# Patient Record
Sex: Male | Born: 1960 | Race: White | Hispanic: No | Marital: Married | Smoking: Never smoker
Health system: Southern US, Community
[De-identification: ages and names within clinical notes are randomized; demographics above are authoritative.]

## PROBLEM LIST (undated history)

## (undated) DIAGNOSIS — I429 Cardiomyopathy, unspecified: Secondary | ICD-10-CM

## (undated) DIAGNOSIS — E119 Type 2 diabetes mellitus without complications: Secondary | ICD-10-CM

## (undated) DIAGNOSIS — I10 Essential (primary) hypertension: Secondary | ICD-10-CM

## (undated) DIAGNOSIS — C801 Malignant (primary) neoplasm, unspecified: Secondary | ICD-10-CM

## (undated) HISTORY — PX: TUMOR EXCISION: SHX421

## (undated) HISTORY — PX: NEPHRECTOMY: SHX65

## (undated) HISTORY — PX: KNEE ARTHROSCOPY: SHX127

---

## 2018-09-16 ENCOUNTER — Inpatient Hospital Stay (HOSPITAL_COMMUNITY): Payer: Medicare HMO

## 2018-09-16 ENCOUNTER — Encounter (HOSPITAL_COMMUNITY): Admission: EM | Disposition: A | Discharge status: Home / Self Care | Payer: Self-pay | Source: Home / Self Care

## 2018-09-16 ENCOUNTER — Other Ambulatory Visit: Payer: Self-pay

## 2018-09-16 ENCOUNTER — Inpatient Hospital Stay (HOSPITAL_COMMUNITY)
Admission: EM | Admit: 2018-09-16 | Discharge: 2018-09-26 | DRG: 958 | Disposition: A | Discharge status: Home / Self Care | Payer: Medicare HMO | Attending: General Surgery | Admitting: General Surgery

## 2018-09-16 ENCOUNTER — Emergency Department (HOSPITAL_COMMUNITY): Payer: Medicare HMO

## 2018-09-16 ENCOUNTER — Emergency Department (HOSPITAL_COMMUNITY): Payer: Medicare HMO | Admitting: Anesthesiology

## 2018-09-16 ENCOUNTER — Encounter (HOSPITAL_COMMUNITY): Payer: Self-pay | Admitting: Emergency Medicine

## 2018-09-16 DIAGNOSIS — D496 Neoplasm of unspecified behavior of brain: Secondary | ICD-10-CM | POA: Diagnosis present

## 2018-09-16 DIAGNOSIS — I429 Cardiomyopathy, unspecified: Secondary | ICD-10-CM | POA: Diagnosis present

## 2018-09-16 DIAGNOSIS — R402253 Coma scale, best verbal response, oriented, at hospital admission: Secondary | ICD-10-CM | POA: Diagnosis present

## 2018-09-16 DIAGNOSIS — S82401A Unspecified fracture of shaft of right fibula, initial encounter for closed fracture: Secondary | ICD-10-CM | POA: Diagnosis present

## 2018-09-16 DIAGNOSIS — S065X0A Traumatic subdural hemorrhage without loss of consciousness, initial encounter: Secondary | ICD-10-CM | POA: Diagnosis present

## 2018-09-16 DIAGNOSIS — S066X0A Traumatic subarachnoid hemorrhage without loss of consciousness, initial encounter: Secondary | ICD-10-CM | POA: Diagnosis present

## 2018-09-16 DIAGNOSIS — S27329A Contusion of lung, unspecified, initial encounter: Secondary | ICD-10-CM | POA: Diagnosis present

## 2018-09-16 DIAGNOSIS — N182 Chronic kidney disease, stage 2 (mild): Secondary | ICD-10-CM | POA: Diagnosis present

## 2018-09-16 DIAGNOSIS — R443 Hallucinations, unspecified: Secondary | ICD-10-CM | POA: Diagnosis present

## 2018-09-16 DIAGNOSIS — Z85528 Personal history of other malignant neoplasm of kidney: Secondary | ICD-10-CM

## 2018-09-16 DIAGNOSIS — R339 Retention of urine, unspecified: Secondary | ICD-10-CM | POA: Diagnosis present

## 2018-09-16 DIAGNOSIS — S82202A Unspecified fracture of shaft of left tibia, initial encounter for closed fracture: Secondary | ICD-10-CM | POA: Diagnosis present

## 2018-09-16 DIAGNOSIS — Z20828 Contact with and (suspected) exposure to other viral communicable diseases: Secondary | ICD-10-CM | POA: Diagnosis present

## 2018-09-16 DIAGNOSIS — S7290XA Unspecified fracture of unspecified femur, initial encounter for closed fracture: Secondary | ICD-10-CM | POA: Diagnosis present

## 2018-09-16 DIAGNOSIS — Z23 Encounter for immunization: Secondary | ICD-10-CM | POA: Diagnosis present

## 2018-09-16 DIAGNOSIS — Z419 Encounter for procedure for purposes other than remedying health state, unspecified: Secondary | ICD-10-CM

## 2018-09-16 DIAGNOSIS — I129 Hypertensive chronic kidney disease with stage 1 through stage 4 chronic kidney disease, or unspecified chronic kidney disease: Secondary | ICD-10-CM | POA: Diagnosis present

## 2018-09-16 DIAGNOSIS — S2242XA Multiple fractures of ribs, left side, initial encounter for closed fracture: Secondary | ICD-10-CM | POA: Diagnosis present

## 2018-09-16 DIAGNOSIS — S82402A Unspecified fracture of shaft of left fibula, initial encounter for closed fracture: Secondary | ICD-10-CM | POA: Diagnosis present

## 2018-09-16 DIAGNOSIS — S2232XA Fracture of one rib, left side, initial encounter for closed fracture: Secondary | ICD-10-CM

## 2018-09-16 DIAGNOSIS — R402363 Coma scale, best motor response, obeys commands, at hospital admission: Secondary | ICD-10-CM | POA: Diagnosis present

## 2018-09-16 DIAGNOSIS — S82201A Unspecified fracture of shaft of right tibia, initial encounter for closed fracture: Secondary | ICD-10-CM | POA: Diagnosis present

## 2018-09-16 DIAGNOSIS — S2249XA Multiple fractures of ribs, unspecified side, initial encounter for closed fracture: Secondary | ICD-10-CM | POA: Diagnosis present

## 2018-09-16 DIAGNOSIS — D649 Anemia, unspecified: Secondary | ICD-10-CM | POA: Diagnosis present

## 2018-09-16 DIAGNOSIS — R402143 Coma scale, eyes open, spontaneous, at hospital admission: Secondary | ICD-10-CM | POA: Diagnosis present

## 2018-09-16 DIAGNOSIS — T07XXXA Unspecified multiple injuries, initial encounter: Secondary | ICD-10-CM

## 2018-09-16 DIAGNOSIS — E1122 Type 2 diabetes mellitus with diabetic chronic kidney disease: Secondary | ICD-10-CM | POA: Diagnosis present

## 2018-09-16 DIAGNOSIS — S2239XA Fracture of one rib, unspecified side, initial encounter for closed fracture: Secondary | ICD-10-CM | POA: Diagnosis present

## 2018-09-16 HISTORY — DX: Type 2 diabetes mellitus without complications: E11.9

## 2018-09-16 HISTORY — DX: Malignant (primary) neoplasm, unspecified: C80.1

## 2018-09-16 HISTORY — DX: Cardiomyopathy, unspecified: I42.9

## 2018-09-16 HISTORY — PX: TIBIA IM NAIL INSERTION: SHX2516

## 2018-09-16 HISTORY — DX: Essential (primary) hypertension: I10

## 2018-09-16 LAB — SARS CORONAVIRUS 2 BY RT PCR (HOSPITAL ORDER, PERFORMED IN ~~LOC~~ HOSPITAL LAB): SARS Coronavirus 2: NEGATIVE

## 2018-09-16 LAB — COMPREHENSIVE METABOLIC PANEL
ALT: 31 U/L (ref 0–44)
AST: 33 U/L (ref 15–41)
Albumin: 4.5 g/dL (ref 3.5–5.0)
Alkaline Phosphatase: 80 U/L (ref 38–126)
Anion gap: 12 (ref 5–15)
BUN: 18 mg/dL (ref 6–20)
CO2: 20 mmol/L — ABNORMAL LOW (ref 22–32)
Calcium: 9.8 mg/dL (ref 8.9–10.3)
Chloride: 111 mmol/L (ref 98–111)
Creatinine, Ser: 1.59 mg/dL — ABNORMAL HIGH (ref 0.61–1.24)
GFR calc Af Amer: 54 mL/min — ABNORMAL LOW (ref >60)
GFR calc non Af Amer: 47 mL/min — ABNORMAL LOW (ref >60)
Glucose, Bld: 221 mg/dL — ABNORMAL HIGH (ref 70–99)
Potassium: 4.6 mmol/L (ref 3.5–5.1)
Sodium: 143 mmol/L (ref 135–145)
Total Bilirubin: 1.1 mg/dL (ref 0.3–1.2)
Total Protein: 7.6 g/dL (ref 6.5–8.1)

## 2018-09-16 LAB — SAMPLE TO BLOOD BANK

## 2018-09-16 LAB — CBC
HCT: 46 % (ref 39.0–52.0)
Hemoglobin: 16.1 g/dL (ref 13.0–17.0)
MCH: 30.1 pg (ref 26.0–34.0)
MCHC: 35 g/dL (ref 30.0–36.0)
MCV: 86 fL (ref 80.0–100.0)
Platelets: 250 10*3/uL (ref 150–400)
RBC: 5.35 MIL/uL (ref 4.22–5.81)
RDW: 12 % (ref 11.5–15.5)
WBC: 13.3 10*3/uL — ABNORMAL HIGH (ref 4.0–10.5)
nRBC: 0 % (ref 0.0–0.2)

## 2018-09-16 LAB — I-STAT CHEM 8, ED
BUN: 20 mg/dL (ref 6–20)
Calcium, Ion: 1.15 mmol/L (ref 1.15–1.40)
Chloride: 112 mmol/L — ABNORMAL HIGH (ref 98–111)
Creatinine, Ser: 1.5 mg/dL — ABNORMAL HIGH (ref 0.61–1.24)
Glucose, Bld: 216 mg/dL — ABNORMAL HIGH (ref 70–99)
HCT: 46 % (ref 39.0–52.0)
Hemoglobin: 15.6 g/dL (ref 13.0–17.0)
Potassium: 4.6 mmol/L (ref 3.5–5.1)
Sodium: 144 mmol/L (ref 135–145)
TCO2: 22 mmol/L (ref 22–32)

## 2018-09-16 LAB — LACTIC ACID, PLASMA: Lactic Acid, Venous: 2.1 mmol/L (ref 0.5–1.9)

## 2018-09-16 LAB — ETHANOL: Alcohol, Ethyl (B): <10 mg/dL (ref <10)

## 2018-09-16 LAB — PROTIME-INR
INR: 1.1 (ref 0.8–1.2)
Prothrombin Time: 13.6 seconds (ref 11.4–15.2)

## 2018-09-16 LAB — CDS SEROLOGY

## 2018-09-16 SURGERY — INSERTION, INTRAMEDULLARY ROD, TIBIA
Anesthesia: General | Site: Leg Lower | Laterality: Left

## 2018-09-16 MED ORDER — SODIUM CHLORIDE 0.9 % IV SOLN
INTRAVENOUS | Status: DC
  Administered 2018-09-16 – 2018-09-20 (×3): via INTRAVENOUS

## 2018-09-16 MED ORDER — SUCCINYLCHOLINE CHLORIDE 200 MG/10ML IV SOSY
PREFILLED_SYRINGE | INTRAVENOUS | Status: AC
  Filled 2018-09-16: qty 10

## 2018-09-16 MED ORDER — ONDANSETRON HCL 4 MG/2ML IJ SOLN: 4.0000 mg | Freq: Four times a day (QID) | INTRAMUSCULAR | Status: DC | PRN

## 2018-09-16 MED ORDER — METHOCARBAMOL 500 MG PO TABS
500.0000 mg | ORAL_TABLET | Freq: Four times a day (QID) | ORAL | Status: DC | PRN
  Administered 2018-09-17 – 2018-09-26 (×9): 500 mg via ORAL
  Filled 2018-09-16 (×9): qty 1

## 2018-09-16 MED ORDER — PHENYLEPHRINE HCL (PRESSORS) 10 MG/ML IV SOLN
INTRAVENOUS | Status: DC | PRN
  Administered 2018-09-16 (×5): 80 ug via INTRAVENOUS

## 2018-09-16 MED ORDER — ROCURONIUM BROMIDE 10 MG/ML (PF) SYRINGE
PREFILLED_SYRINGE | INTRAVENOUS | Status: AC
  Filled 2018-09-16: qty 10

## 2018-09-16 MED ORDER — LIDOCAINE 2% (20 MG/ML) 5 ML SYRINGE
INTRAMUSCULAR | Status: AC
  Filled 2018-09-16: qty 5

## 2018-09-16 MED ORDER — METOCLOPRAMIDE HCL 5 MG/ML IJ SOLN: 5.0000 mg | Freq: Three times a day (TID) | INTRAMUSCULAR | Status: DC | PRN

## 2018-09-16 MED ORDER — BUPIVACAINE HCL (PF) 0.25 % IJ SOLN
INTRAMUSCULAR | Status: AC
  Filled 2018-09-16: qty 30

## 2018-09-16 MED ORDER — CEFAZOLIN SODIUM-DEXTROSE 2-4 GM/100ML-% IV SOLN
2.0000 g | Freq: Four times a day (QID) | INTRAVENOUS | Status: AC
  Administered 2018-09-16 – 2018-09-17 (×3): 2 g via INTRAVENOUS
  Filled 2018-09-16 (×4): qty 100

## 2018-09-16 MED ORDER — PROPOFOL 10 MG/ML IV BOLUS
INTRAVENOUS | Status: DC | PRN
  Administered 2018-09-16: 180 mg via INTRAVENOUS

## 2018-09-16 MED ORDER — FENTANYL CITRATE (PF) 250 MCG/5ML IJ SOLN
INTRAMUSCULAR | Status: AC
  Filled 2018-09-16: qty 5

## 2018-09-16 MED ORDER — ONDANSETRON HCL 4 MG/2ML IJ SOLN
INTRAMUSCULAR | Status: AC
  Filled 2018-09-16: qty 2

## 2018-09-16 MED ORDER — LABETALOL HCL 5 MG/ML IV SOLN: 10.0000 mg | INTRAVENOUS | Status: DC | PRN

## 2018-09-16 MED ORDER — DIPHENHYDRAMINE HCL 12.5 MG/5ML PO ELIX: 12.5000 mg | ORAL_SOLUTION | ORAL | Status: DC | PRN

## 2018-09-16 MED ORDER — ACETAMINOPHEN 325 MG PO TABS: 650.0000 mg | ORAL_TABLET | ORAL | Status: DC | PRN

## 2018-09-16 MED ORDER — LABETALOL HCL 5 MG/ML IV SOLN
10.0000 mg | Freq: Once | INTRAVENOUS | Status: AC
  Administered 2018-09-16: 10 mg via INTRAVENOUS

## 2018-09-16 MED ORDER — FENTANYL CITRATE (PF) 100 MCG/2ML IJ SOLN
25.0000 ug | INTRAMUSCULAR | Status: DC | PRN
  Administered 2018-09-16: 50 ug via INTRAVENOUS

## 2018-09-16 MED ORDER — FENTANYL CITRATE (PF) 100 MCG/2ML IJ SOLN
INTRAMUSCULAR | Status: AC
  Filled 2018-09-16: qty 2

## 2018-09-16 MED ORDER — MIDAZOLAM HCL 2 MG/2ML IJ SOLN
INTRAMUSCULAR | Status: AC
  Filled 2018-09-16: qty 2

## 2018-09-16 MED ORDER — HYDROMORPHONE HCL 1 MG/ML IJ SOLN
0.5000 mg | INTRAMUSCULAR | Status: DC | PRN
  Administered 2018-09-16 – 2018-09-18 (×8): 1 mg via INTRAVENOUS
  Filled 2018-09-16 (×8): qty 1

## 2018-09-16 MED ORDER — SUGAMMADEX SODIUM 200 MG/2ML IV SOLN
INTRAVENOUS | Status: DC | PRN
  Administered 2018-09-16: 200 mg via INTRAVENOUS

## 2018-09-16 MED ORDER — ACETAMINOPHEN 500 MG PO TABS
1000.0000 mg | ORAL_TABLET | Freq: Three times a day (TID) | ORAL | Status: AC
  Administered 2018-09-16 – 2018-09-17 (×4): 1000 mg via ORAL
  Filled 2018-09-16 (×4): qty 2

## 2018-09-16 MED ORDER — CEFAZOLIN SODIUM-DEXTROSE 2-4 GM/100ML-% IV SOLN
2.0000 g | Freq: Once | INTRAVENOUS | Status: AC
  Administered 2018-09-16: 2 g via INTRAVENOUS

## 2018-09-16 MED ORDER — PROMETHAZINE HCL 25 MG/ML IJ SOLN: 6.2500 mg | INTRAMUSCULAR | Status: DC | PRN

## 2018-09-16 MED ORDER — DOCUSATE SODIUM 100 MG PO CAPS
100.0000 mg | ORAL_CAPSULE | Freq: Two times a day (BID) | ORAL | Status: DC
  Administered 2018-09-16 – 2018-09-26 (×20): 100 mg via ORAL
  Filled 2018-09-16 (×20): qty 1

## 2018-09-16 MED ORDER — OXYCODONE HCL 5 MG PO TABS
5.0000 mg | ORAL_TABLET | ORAL | Status: DC | PRN
  Administered 2018-09-17: 10 mg via ORAL
  Administered 2018-09-17: 5 mg via ORAL
  Administered 2018-09-17: 10 mg via ORAL
  Administered 2018-09-17 (×2): 5 mg via ORAL
  Administered 2018-09-18 – 2018-09-19 (×4): 10 mg via ORAL
  Administered 2018-09-19: 5 mg via ORAL
  Administered 2018-09-19 (×2): 10 mg via ORAL
  Administered 2018-09-20: 5 mg via ORAL
  Administered 2018-09-20 – 2018-09-21 (×2): 10 mg via ORAL
  Filled 2018-09-16: qty 1
  Filled 2018-09-16: qty 2
  Filled 2018-09-16: qty 1
  Filled 2018-09-16 (×8): qty 2
  Filled 2018-09-16: qty 1
  Filled 2018-09-16 (×2): qty 2

## 2018-09-16 MED ORDER — FENTANYL CITRATE (PF) 100 MCG/2ML IJ SOLN
50.0000 ug | INTRAMUSCULAR | Status: DC | PRN
  Administered 2018-09-16 (×2): 50 ug via INTRAVENOUS
  Filled 2018-09-16 (×2): qty 2

## 2018-09-16 MED ORDER — BACITRACIN-NEOMYCIN-POLYMYXIN 400-5-5000 EX OINT
TOPICAL_OINTMENT | CUTANEOUS | Status: AC
  Filled 2018-09-16: qty 1

## 2018-09-16 MED ORDER — SODIUM CHLORIDE 0.9 % IV SOLN: INTRAVENOUS | Status: DC

## 2018-09-16 MED ORDER — PROPOFOL 10 MG/ML IV BOLUS
INTRAVENOUS | Status: AC
  Filled 2018-09-16: qty 20

## 2018-09-16 MED ORDER — METOCLOPRAMIDE HCL 5 MG PO TABS
5.0000 mg | ORAL_TABLET | Freq: Three times a day (TID) | ORAL | Status: DC | PRN
  Filled 2018-09-16: qty 2

## 2018-09-16 MED ORDER — MORPHINE SULFATE (PF) 2 MG/ML IV SOLN: 2.0000 mg | INTRAVENOUS | Status: DC | PRN

## 2018-09-16 MED ORDER — OXYCODONE HCL 5 MG/5ML PO SOLN: 5.0000 mg | Freq: Once | ORAL | Status: DC | PRN

## 2018-09-16 MED ORDER — ROCURONIUM BROMIDE 50 MG/5ML IV SOSY
PREFILLED_SYRINGE | INTRAVENOUS | Status: DC | PRN
  Administered 2018-09-16: 40 mg via INTRAVENOUS

## 2018-09-16 MED ORDER — LABETALOL HCL 5 MG/ML IV SOLN
INTRAVENOUS | Status: AC
  Filled 2018-09-16: qty 4

## 2018-09-16 MED ORDER — BUPIVACAINE HCL (PF) 0.25 % IJ SOLN
INTRAMUSCULAR | Status: DC | PRN
  Administered 2018-09-16: 25 mL

## 2018-09-16 MED ORDER — MIDAZOLAM HCL 5 MG/5ML IJ SOLN
INTRAMUSCULAR | Status: DC | PRN
  Administered 2018-09-16: 2 mg via INTRAVENOUS

## 2018-09-16 MED ORDER — METHOCARBAMOL 1000 MG/10ML IJ SOLN
500.0000 mg | Freq: Four times a day (QID) | INTRAVENOUS | Status: DC | PRN
  Filled 2018-09-16: qty 5

## 2018-09-16 MED ORDER — POLYETHYLENE GLYCOL 3350 17 G PO PACK: 17.0000 g | PACK | Freq: Every day | ORAL | Status: DC | PRN

## 2018-09-16 MED ORDER — FENTANYL CITRATE (PF) 100 MCG/2ML IJ SOLN
INTRAMUSCULAR | Status: DC | PRN
  Administered 2018-09-16: 100 ug via INTRAVENOUS

## 2018-09-16 MED ORDER — ONDANSETRON HCL 4 MG PO TABS: 4.0000 mg | ORAL_TABLET | Freq: Four times a day (QID) | ORAL | Status: DC | PRN

## 2018-09-16 MED ORDER — DOCUSATE SODIUM 100 MG PO CAPS: 100.0000 mg | ORAL_CAPSULE | Freq: Two times a day (BID) | ORAL | Status: DC

## 2018-09-16 MED ORDER — LACTATED RINGERS IV SOLN
INTRAVENOUS | Status: DC
  Administered 2018-09-16: 19:00:00 via INTRAVENOUS

## 2018-09-16 MED ORDER — SUCCINYLCHOLINE CHLORIDE 20 MG/ML IJ SOLN
INTRAMUSCULAR | Status: DC | PRN
  Administered 2018-09-16: 120 mg via INTRAVENOUS

## 2018-09-16 MED ORDER — CEFAZOLIN SODIUM-DEXTROSE 2-4 GM/100ML-% IV SOLN
INTRAVENOUS | Status: AC
  Filled 2018-09-16: qty 100

## 2018-09-16 MED ORDER — OXYCODONE HCL 5 MG PO TABS: 5.0000 mg | ORAL_TABLET | Freq: Once | ORAL | Status: DC | PRN

## 2018-09-16 MED ORDER — ONDANSETRON HCL 4 MG/2ML IJ SOLN
INTRAMUSCULAR | Status: DC | PRN
  Administered 2018-09-16: 4 mg via INTRAVENOUS

## 2018-09-16 MED ORDER — GABAPENTIN 300 MG PO CAPS
300.0000 mg | ORAL_CAPSULE | Freq: Three times a day (TID) | ORAL | Status: DC
  Administered 2018-09-16 – 2018-09-18 (×7): 300 mg via ORAL
  Filled 2018-09-16 (×7): qty 1

## 2018-09-16 MED ORDER — LIDOCAINE 2% (20 MG/ML) 5 ML SYRINGE
INTRAMUSCULAR | Status: DC | PRN
  Administered 2018-09-16: 60 mg via INTRAVENOUS

## 2018-09-16 MED ORDER — TETANUS-DIPHTH-ACELL PERTUSSIS 5-2.5-18.5 LF-MCG/0.5 IM SUSP
0.5000 mL | Freq: Once | INTRAMUSCULAR | Status: AC
  Administered 2018-09-16: 0.5 mL via INTRAMUSCULAR
  Filled 2018-09-16: qty 0.5

## 2018-09-16 MED ORDER — 0.9 % SODIUM CHLORIDE (POUR BTL) OPTIME
TOPICAL | Status: DC | PRN
  Administered 2018-09-16: 19:00:00 1000 mL

## 2018-09-16 MED ORDER — CHLORHEXIDINE GLUCONATE CLOTH 2 % EX PADS
6.0000 | MEDICATED_PAD | Freq: Every day | CUTANEOUS | Status: DC
  Administered 2018-09-18 – 2018-09-25 (×6): 6 via TOPICAL

## 2018-09-16 MED ORDER — ACETAMINOPHEN 325 MG PO TABS
325.0000 mg | ORAL_TABLET | Freq: Four times a day (QID) | ORAL | Status: DC | PRN
  Administered 2018-09-18 – 2018-09-21 (×3): 650 mg via ORAL
  Filled 2018-09-16 (×3): qty 2

## 2018-09-16 SURGICAL SUPPLY — 68 items
BIT DRILL AO GAMMA 4.2X130 (BIT) ×2 IMPLANT
BIT DRILL AO GAMMA 4.2X340 (BIT) ×2 IMPLANT
BLADE SURG 10 STRL SS (BLADE) ×3 IMPLANT
BNDG COHESIVE 4X5 TAN STRL (GAUZE/BANDAGES/DRESSINGS) ×3 IMPLANT
BNDG ELASTIC 4X5.8 VLCR STR LF (GAUZE/BANDAGES/DRESSINGS) ×3 IMPLANT
BNDG ELASTIC 6X10 VLCR STRL LF (GAUZE/BANDAGES/DRESSINGS) ×2 IMPLANT
BNDG ELASTIC 6X5.8 VLCR STR LF (GAUZE/BANDAGES/DRESSINGS) ×3 IMPLANT
BNDG GAUZE ELAST 4 BULKY (GAUZE/BANDAGES/DRESSINGS) ×3 IMPLANT
CHLORAPREP W/TINT 26 (MISCELLANEOUS) ×5 IMPLANT
COVER MAYO STAND STRL (DRAPES) ×3 IMPLANT
COVER SURGICAL LIGHT HANDLE (MISCELLANEOUS) ×4 IMPLANT
COVER WAND RF STERILE (DRAPES) ×3 IMPLANT
DRAPE C-ARM 42X72 X-RAY (DRAPES) ×3 IMPLANT
DRAPE C-ARMOR (DRAPES) ×2 IMPLANT
DRAPE HALF SHEET 40X57 (DRAPES) ×3 IMPLANT
DRAPE IMP U-DRAPE 54X76 (DRAPES) ×3 IMPLANT
DRESSING OPSITE X SMALL 2X3 (GAUZE/BANDAGES/DRESSINGS) ×3 IMPLANT
DRSG ADAPTIC 3X8 NADH LF (GAUZE/BANDAGES/DRESSINGS) ×3 IMPLANT
DRSG PAD ABDOMINAL 8X10 ST (GAUZE/BANDAGES/DRESSINGS) ×6 IMPLANT
DRSG TEGADERM 4X4.75 (GAUZE/BANDAGES/DRESSINGS) ×6 IMPLANT
ELECT CAUTERY BLADE 6.4 (BLADE) ×3 IMPLANT
ELECT REM PT RETURN 9FT ADLT (ELECTROSURGICAL) ×3
ELECTRODE REM PT RTRN 9FT ADLT (ELECTROSURGICAL) ×1 IMPLANT
GAUZE SPONGE 4X4 12PLY STRL (GAUZE/BANDAGES/DRESSINGS) ×3 IMPLANT
GLOVE BIO SURGEON STRL SZ7.5 (GLOVE) ×6 IMPLANT
GLOVE BIOGEL PI IND STRL 8 (GLOVE) ×2 IMPLANT
GLOVE BIOGEL PI INDICATOR 8 (GLOVE) ×4
GOWN STRL REUS W/ TWL LRG LVL3 (GOWN DISPOSABLE) ×3 IMPLANT
GOWN STRL REUS W/TWL LRG LVL3 (GOWN DISPOSABLE) ×6
GUIDEROD T2 3X1000 (ROD) ×2 IMPLANT
GUIDEWIRE GAMMA (WIRE) ×2 IMPLANT
K-WIRE FIXATION 3X285 COATED (WIRE) ×6
KIT BASIN OR (CUSTOM PROCEDURE TRAY) ×3 IMPLANT
KIT TURNOVER KIT B (KITS) ×3 IMPLANT
KWIRE FIXATION 3X285 COATED (WIRE) IMPLANT
MANIFOLD NEPTUNE II (INSTRUMENTS) ×3 IMPLANT
NAIL ELAS INSERT SLV SPI 8-11 (MISCELLANEOUS) ×2 IMPLANT
NAIL TIBIAL STD 9X345MM (Nail) ×2 IMPLANT
NS IRRIG 1000ML POUR BTL (IV SOLUTION) ×3 IMPLANT
PACK ORTHO EXTREMITY (CUSTOM PROCEDURE TRAY) ×3 IMPLANT
PACK UNIVERSAL I (CUSTOM PROCEDURE TRAY) ×3 IMPLANT
PAD ARMBOARD 7.5X6 YLW CONV (MISCELLANEOUS) ×6 IMPLANT
PAD CAST 4YDX4 CTTN HI CHSV (CAST SUPPLIES) ×1 IMPLANT
PADDING CAST COTTON 4X4 STRL (CAST SUPPLIES) ×2
PADDING CAST COTTON 6X4 STRL (CAST SUPPLIES) ×4 IMPLANT
REAMER INTRAMEDULLARY 8MM 510 (MISCELLANEOUS) ×2 IMPLANT
SCREW LOCKING T2 F/T  5MMX45MM (Screw) ×2 IMPLANT
SCREW LOCKING T2 F/T  5MMX50MM (Screw) ×2 IMPLANT
SCREW LOCKING T2 F/T  5X42.5MM (Screw) ×2 IMPLANT
SCREW LOCKING T2 F/T 5MMX45MM (Screw) IMPLANT
SCREW LOCKING T2 F/T 5MMX50MM (Screw) IMPLANT
SCREW LOCKING T2 F/T 5X42.5MM (Screw) IMPLANT
SCREW LOCKING THREADED 5X47.5 (Screw) ×4 IMPLANT
SPONGE LAP 18X18 RF (DISPOSABLE) ×3 IMPLANT
STAPLER VISISTAT 35W (STAPLE) IMPLANT
SUT ETHILON 3 0 PS 1 (SUTURE) ×6 IMPLANT
SUT MON AB 2-0 CT1 27 (SUTURE) ×3 IMPLANT
SUT VIC AB 0 CT1 27 (SUTURE) ×4
SUT VIC AB 0 CT1 27XBRD ANBCTR (SUTURE) ×1 IMPLANT
SYR BULB IRRIGATION 50ML (SYRINGE) ×3 IMPLANT
SYR CONTROL 10ML LL (SYRINGE) ×2 IMPLANT
TOWEL GREEN STERILE (TOWEL DISPOSABLE) ×3 IMPLANT
TOWEL GREEN STERILE FF (TOWEL DISPOSABLE) ×3 IMPLANT
TOWEL OR NON WOVEN STRL DISP B (DISPOSABLE) ×3 IMPLANT
TUBE CONNECTING 12'X1/4 (SUCTIONS) ×1
TUBE CONNECTING 12X1/4 (SUCTIONS) ×2 IMPLANT
WATER STERILE IRR 1000ML POUR (IV SOLUTION) ×3 IMPLANT
YANKAUER SUCT BULB TIP NO VENT (SUCTIONS) ×3 IMPLANT

## 2018-09-16 NOTE — Transfer of Care (Signed)
Immediate Anesthesia Transfer of Care Note  Patient: Jorge Cook  Procedure(s) Performed: INTRAMEDULLARY (IM) NAIL TIBIAL (Left Leg Lower)  Patient Location: PACU  Anesthesia Type:General  Level of Consciousness: sedated  Airway & Oxygen Therapy: Patient connected to face mask oxygen  Post-op Assessment: Report given to RN and Post -op Vital signs reviewed and stable  Post vital signs: Reviewed and stable  Last Vitals:  Vitals Value Taken Time  BP 139/94 09/16/18 2051  Temp    Pulse 97 09/16/18 2055  Resp 10 09/16/18 2055  SpO2 91 % 09/16/18 2055  Vitals shown include unvalidated device data.  Last Pain:  Vitals:   09/16/18 1630  TempSrc:   PainSc: 0-No pain         Complications: No apparent anesthesia complications

## 2018-09-16 NOTE — Progress Notes (Signed)
Orthopedic Tech Progress Note Patient Details:  Jorge Cook May 29, 1960 [id number]  Ortho Devices Type of Ortho Device: CAM walker Ortho Device/Splint Location: cam walker Ortho Device/Splint Interventions: Application   Post Interventions Patient Tolerated: Well Instructions Provided: Care of device   [name] 09/16/2018, 6:48 PM
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
## 2018-09-16 NOTE — Anesthesia Preprocedure Evaluation (Addendum)
Anesthesia Evaluation  Patient identified by MRN, date of birth, ID band Patient awake    Reviewed: Allergy & Precautions, NPO status , Patient's Chart, lab work & pertinent test results  History of Anesthesia Complications Negative for: history of anesthetic complications  Airway Mallampati: II  TM Distance: >3 FB Neck ROM: Limited    Dental  (+) Dental Advisory Given, Teeth Intact   Pulmonary   Lung cancer s/p ?lobectomy and radiation     breath sounds clear to auscultation       Cardiovascular negative cardio ROS   Rhythm:Regular Rate:Tachycardia     Neuro/Psych Seizures - (related to medication reaction), Well Controlled,   In C-collar  negative psych ROS   GI/Hepatic negative GI ROS, Neg liver ROS,   Endo/Other  negative endocrine ROS  Renal/GU Renal InsufficiencyRenal disease Renal cancer s/p left nephrectomy       Musculoskeletal negative musculoskeletal ROS (+)   Abdominal   Peds  Hematology negative hematology ROS (+)   Anesthesia Other Findings   Reproductive/Obstetrics                            Anesthesia Physical Anesthesia Plan  ASA: II and emergent  Anesthesia Plan: General   Post-op Pain Management:    Induction: Intravenous  PONV Risk Score and Plan: 3 and Treatment may vary due to age or medical condition, Ondansetron, Dexamethasone and Midazolam  Airway Management Planned: Oral ETT and Video Laryngoscope Planned  Additional Equipment: None  Intra-op Plan:   Post-operative Plan: Possible Post-op intubation/ventilation  Informed Consent: I have reviewed the patients History and Physical, chart, labs and discussed the procedure including the risks, benefits and alternatives for the proposed anesthesia with the patient or authorized representative who has indicated his/her understanding and acceptance.     Dental advisory given  Plan Discussed  with: CRNA and Anesthesiologist  Anesthesia Plan Comments:        Anesthesia Quick Evaluation
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
## 2018-09-16 NOTE — ED Notes (Signed)
Pts wife Marlowe Kays [phone]

## 2018-09-16 NOTE — Op Note (Signed)
09/16/2018  8:31 PM  PATIENT:  Jorge Cook    PRE-OPERATIVE DIAGNOSIS:  tibia fracture  POST-OPERATIVE DIAGNOSIS:  Same  PROCEDURE:  INTRAMEDULLARY (IM) NAIL TIBIAL  SURGEON:  Renette Butters, MD  ASSISTANT: Roxan Hockey, PA-C, he was present and scrubbed throughout the case, critical for completion in a timely fashion, and for retraction, instrumentation, and closure.   ANESTHESIA:   General  PREOPERATIVE INDICATIONS:  [name] is a  58 y.o. male with a diagnosis of tibia fracture who failed conservative measures and elected for surgical management.    The risks benefits and alternatives were discussed with the patient preoperatively including but not limited to the risks of infection, bleeding, nerve injury, cardiopulmonary complications, the need for revision surgery, among others, and the patient was willing to proceed.  OPERATIVE IMPLANTS: [employer] T2 Tibial nail    BLOOD LOSS: minimal  COMPLICATIONS: none  TOURNIQUET TIME: none  OPERATIVE PROCEDURE:  Patient was identified in the preoperative holding area and site was marked by me He was transported to the operating theater and placed on the table in supine position taking care to pad all bony prominences. After a preincinduction time out anesthesia was induced. The left lower extremity was prepped and draped in normal sterile fashion and a pre-incision timeout was performed. Jorge Cook received ancef for preoperative antibiotics.   The leg was placed over the bone foam. I then made a 4 cm incision over the quad tendon. I dissected down and incised the quad tendon. I placed the suprapatellar sleeve and secured it into place taking care to not damage the patella femoral joint.   I placed a guidewire under fluoroscopic guidance just medial to lateral tibial spine. I was happy with this placement on all views. I used the entry reamer to create a path into the proximal tibia staying out of the  joint itself.  I then passed the ball tip guidewire down the tibia and across the fracture site. I held appropriate reduction confirmed on multiple views of fluoroscopy and sequentially reamed up to an appropriate size with appropriate chatter. I selected the above-sized nail and passed it down the ball-tipped guidewire and seated it completely to a was sunk into the proximal tibia.  I then used the outrigger placed proximal locking screws. I was happy with her length and placement on multiple oblique fluoroscopic views.  Next I confirmed appropriate rotation of the tibia with fracture tease and orientation of the patella and toes. I then used a perfect circles technique to place a distal static interlock screw.  The wounds were then all thoroughly irrigated and closed in layers. Sterile dressing was applied and he was taken to the PACU in stable condition.  POST OPERATIVE PLAN: WBAT, DVT prophylaxis: Early ambulation, SCD's, and chemical px per primary and neuro team

## 2018-09-16 NOTE — Anesthesia Postprocedure Evaluation (Signed)
Anesthesia Post Note  Patient: Jorge Cook  Procedure(s) Performed: INTRAMEDULLARY (IM) NAIL TIBIAL (Left Leg Lower)     Patient location during evaluation: PACU Anesthesia Type: General Level of consciousness: awake and alert Pain management: pain level controlled Vital Signs Assessment: post-procedure vital signs reviewed and stable Respiratory status: spontaneous breathing, nonlabored ventilation, respiratory function stable and patient connected to nasal cannula oxygen Cardiovascular status: blood pressure returned to baseline and stable Postop Assessment: no apparent nausea or vomiting Anesthetic complications: no    Last Vitals:  Vitals:   09/16/18 2236 09/16/18 2300  BP: (!) 140/100 (!) 155/106  Pulse: 98 (!) 106  Resp: 10 19  Temp: 36.9 C 36.9 C  SpO2: 99% 98%                   Audry Pili

## 2018-09-16 NOTE — Progress Notes (Signed)
Orthopedic Tech Progress Note Patient Details:  Jorge Cook 1960/08/14 [id number]  Ortho Devices Ortho Device/Splint Location: Level 2 Trauma       [name] 09/16/2018, 4:05 PM

## 2018-09-16 NOTE — Anesthesia Procedure Notes (Signed)
Procedure Name: Intubation Date/Time: 09/16/2018 6:58 PM Performed by: Purvis Kilts, CRNA Pre-anesthesia Checklist: Patient identified, Emergency Drugs available, Suction available, Patient being monitored and Timeout performed Patient Re-evaluated:Patient Re-evaluated prior to induction Oxygen Delivery Method: Circle system utilized Preoxygenation: Pre-oxygenation with 100% oxygen Induction Type: IV induction Ventilation: Mask ventilation without difficulty Laryngoscope Size: Glidescope Grade View: Grade I Tube type: Oral Tube size: 7.0 mm Number of attempts: 1 Airway Equipment and Method: Stylet Placement Confirmation: positive ETCO2,  ETT inserted through vocal cords under direct vision and breath sounds checked- equal and bilateral Secured at: 22 cm Tube secured with: Tape Dental Injury: Teeth and Oropharynx as per pre-operative assessment

## 2018-09-17 ENCOUNTER — Encounter (HOSPITAL_COMMUNITY): Payer: Self-pay

## 2018-09-17 LAB — CBC
HCT: 42.8 % (ref 39.0–52.0)
Hemoglobin: 14.5 g/dL (ref 13.0–17.0)
MCH: 29.7 pg (ref 26.0–34.0)
MCHC: 33.9 g/dL (ref 30.0–36.0)
MCV: 87.7 fL (ref 80.0–100.0)
Platelets: 203 10*3/uL (ref 150–400)
RBC: 4.88 MIL/uL (ref 4.22–5.81)
RDW: 12.3 % (ref 11.5–15.5)
WBC: 19.5 10*3/uL — ABNORMAL HIGH (ref 4.0–10.5)
nRBC: 0 % (ref 0.0–0.2)

## 2018-09-17 LAB — GLUCOSE, CAPILLARY
Glucose-Capillary: 171 mg/dL — ABNORMAL HIGH (ref 70–99)
Glucose-Capillary: 175 mg/dL — ABNORMAL HIGH (ref 70–99)
Glucose-Capillary: 176 mg/dL — ABNORMAL HIGH (ref 70–99)
Glucose-Capillary: 176 mg/dL — ABNORMAL HIGH (ref 70–99)
Glucose-Capillary: 182 mg/dL — ABNORMAL HIGH (ref 70–99)
Glucose-Capillary: 190 mg/dL — ABNORMAL HIGH (ref 70–99)
Glucose-Capillary: 247 mg/dL — ABNORMAL HIGH (ref 70–99)

## 2018-09-17 LAB — BASIC METABOLIC PANEL
Anion gap: 12 (ref 5–15)
BUN: 21 mg/dL — ABNORMAL HIGH (ref 6–20)
CO2: 22 mmol/L (ref 22–32)
Calcium: 8.8 mg/dL — ABNORMAL LOW (ref 8.9–10.3)
Chloride: 108 mmol/L (ref 98–111)
Creatinine, Ser: 1.73 mg/dL — ABNORMAL HIGH (ref 0.61–1.24)
GFR calc Af Amer: 49 mL/min — ABNORMAL LOW (ref >60)
GFR calc non Af Amer: 42 mL/min — ABNORMAL LOW (ref >60)
Glucose, Bld: 231 mg/dL — ABNORMAL HIGH (ref 70–99)
Potassium: 5.1 mmol/L (ref 3.5–5.1)
Sodium: 142 mmol/L (ref 135–145)

## 2018-09-17 LAB — MRSA PCR SCREENING: MRSA by PCR: NEGATIVE

## 2018-09-17 LAB — HIV ANTIBODY (ROUTINE TESTING W REFLEX): HIV Screen 4th Generation wRfx: NONREACTIVE

## 2018-09-17 LAB — HEMOGLOBIN A1C
Hgb A1c MFr Bld: 7.4 % — ABNORMAL HIGH (ref 4.8–5.6)
Mean Plasma Glucose: 165.68 mg/dL

## 2018-09-17 MED ORDER — LACOSAMIDE 50 MG PO TABS
200.0000 mg | ORAL_TABLET | Freq: Two times a day (BID) | ORAL | Status: DC
  Administered 2018-09-17 – 2018-09-26 (×20): 200 mg via ORAL
  Filled 2018-09-17 (×2): qty 1
  Filled 2018-09-17: qty 4
  Filled 2018-09-17: qty 1
  Filled 2018-09-17 (×3): qty 4
  Filled 2018-09-17: qty 1
  Filled 2018-09-17 (×3): qty 4
  Filled 2018-09-17: qty 1
  Filled 2018-09-17 (×3): qty 4
  Filled 2018-09-17 (×3): qty 1
  Filled 2018-09-17 (×2): qty 4

## 2018-09-17 MED ORDER — LEVETIRACETAM 500 MG PO TABS
1000.0000 mg | ORAL_TABLET | Freq: Two times a day (BID) | ORAL | Status: DC
  Administered 2018-09-17 – 2018-09-20 (×8): 1000 mg via ORAL
  Filled 2018-09-17 (×9): qty 2

## 2018-09-17 MED ORDER — TAMSULOSIN HCL 0.4 MG PO CAPS
0.4000 mg | ORAL_CAPSULE | Freq: Every day | ORAL | Status: DC
  Administered 2018-09-17 – 2018-09-25 (×9): 0.4 mg via ORAL
  Filled 2018-09-17 (×9): qty 1

## 2018-09-17 MED ORDER — BETHANECHOL CHLORIDE 25 MG PO TABS
25.0000 mg | ORAL_TABLET | Freq: Three times a day (TID) | ORAL | Status: DC
  Administered 2018-09-17 – 2018-09-26 (×28): 25 mg via ORAL
  Filled 2018-09-17: qty 1
  Filled 2018-09-17 (×2): qty 3
  Filled 2018-09-17: qty 1
  Filled 2018-09-17: qty 3
  Filled 2018-09-17 (×2): qty 1
  Filled 2018-09-17: qty 3
  Filled 2018-09-17 (×3): qty 1
  Filled 2018-09-17: qty 3
  Filled 2018-09-17 (×3): qty 1
  Filled 2018-09-17 (×3): qty 3
  Filled 2018-09-17 (×2): qty 1
  Filled 2018-09-17 (×2): qty 3
  Filled 2018-09-17 (×4): qty 1
  Filled 2018-09-17: qty 3
  Filled 2018-09-17 (×5): qty 1

## 2018-09-17 MED ORDER — INSULIN ASPART 100 UNIT/ML ~~LOC~~ SOLN
0.0000 [IU] | SUBCUTANEOUS | Status: DC
  Administered 2018-09-17 (×5): 3 [IU] via SUBCUTANEOUS
  Administered 2018-09-17: 5 [IU] via SUBCUTANEOUS
  Administered 2018-09-18 (×3): 3 [IU] via SUBCUTANEOUS
  Administered 2018-09-18: 13:00:00 5 [IU] via SUBCUTANEOUS
  Administered 2018-09-18 (×2): 3 [IU] via SUBCUTANEOUS
  Administered 2018-09-19: 5 [IU] via SUBCUTANEOUS
  Administered 2018-09-19: 2 [IU] via SUBCUTANEOUS
  Administered 2018-09-19 (×4): 3 [IU] via SUBCUTANEOUS
  Administered 2018-09-20 (×2): 5 [IU] via SUBCUTANEOUS
  Administered 2018-09-20: 2 [IU] via SUBCUTANEOUS
  Administered 2018-09-20 (×2): 3 [IU] via SUBCUTANEOUS
  Administered 2018-09-20: 2 [IU] via SUBCUTANEOUS
  Administered 2018-09-21: 3 [IU] via SUBCUTANEOUS
  Administered 2018-09-21: 2 [IU] via SUBCUTANEOUS
  Administered 2018-09-21: 5 [IU] via SUBCUTANEOUS
  Administered 2018-09-21: 21:00:00 8 [IU] via SUBCUTANEOUS
  Administered 2018-09-21: 3 [IU] via SUBCUTANEOUS
  Administered 2018-09-21: 13:00:00 8 [IU] via SUBCUTANEOUS
  Administered 2018-09-22 (×2): 2 [IU] via SUBCUTANEOUS
  Administered 2018-09-22 (×2): 5 [IU] via SUBCUTANEOUS
  Administered 2018-09-22: 3 [IU] via SUBCUTANEOUS
  Administered 2018-09-22 – 2018-09-23 (×3): 2 [IU] via SUBCUTANEOUS
  Administered 2018-09-23: 3 [IU] via SUBCUTANEOUS
  Administered 2018-09-23 (×2): 8 [IU] via SUBCUTANEOUS
  Administered 2018-09-23: 3 [IU] via SUBCUTANEOUS
  Administered 2018-09-24 (×2): 2 [IU] via SUBCUTANEOUS
  Administered 2018-09-24: 3 [IU] via SUBCUTANEOUS

## 2018-09-17 MED ORDER — LIDOCAINE HCL URETHRAL/MUCOSAL 2 % EX GEL
1.0000 "application " | Freq: Once | CUTANEOUS | Status: AC
  Administered 2018-09-17: 1 via URETHRAL
  Filled 2018-09-17: qty 5

## 2018-09-17 MED ORDER — PAROXETINE HCL 20 MG PO TABS
20.0000 mg | ORAL_TABLET | Freq: Every day | ORAL | Status: DC
  Administered 2018-09-17 – 2018-09-26 (×10): 20 mg via ORAL
  Filled 2018-09-17 (×10): qty 1

## 2018-09-17 NOTE — Progress Notes (Signed)
Patient to 4N21 at 2256, personal belongings at bedside are one set of contact lenses. Patient is neurologically intact at this time with palpable DP pulse on LLE, extremity is warm and dry. Will continue to monitor.  Candy Sledge, RN
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
## 2018-09-17 NOTE — Consult Note (Signed)
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 Intraoperative fluoroscopic views demonstrating intramedullary rod with [name] traversing the femur fracture. Hardware appears appropriately positioned. No evidence of surgical complicating feature. Electronically Signed   By: Franki Cabot M.D.   On: 09/16/2018 21:19  ROS: As above Blood pressure (!) 112/91, pulse (!) 114, temperature 98.3 F (36.8 C), temperature source Oral, resp. rate 10, height 5\' 11"  (1.803 m), weight 94.8 kg, SpO2 97 %. Estimated body mass index is 29.15 kg/m as calculated from the following:   Height as of this encounter: 5\' 11"  (1.803 m).   Weight as of this encounter: 94.8 kg.  Physical Exam:  General: An alert and pleasant 58 year old white male in no apparent distress  HEENT: Normocephalic, pupils equal round reactive light, extraocular muscles are intact  Neck: Unremarkable.  There is no tenderness to palpation or range of motion.  Thorax: Symmetric  Abdomen:  Soft  Extremities: Unremarkable except for his left leg orthosis  Back exam: Unremarkable  Neurologic exam: The patient is alert and oriented x3.  Glasgow Coma Scale 15.  Cranial nerves II through XII were examined bilaterally and grossly normal.  Vision and hearing are grossly normal bilaterally.  His motor strength is 5/5 in spite of bicep, handgrip, right gastrocnemius and dorsiflexors.  He wiggles his left toes well.  His sensation is grossly normal to light touch sensation all tested  dermatomes bilaterally.  Cerebellar function is intact to rapid alternating movements of the upper extremities bilaterally.  Imaging studies: I have reviewed the patient's head CT performed yesterday at [facility].  There is a tiny subarachnoid hemorrhage along the left frontal convexity without mass-effect.  I have also reviewed the patient's cervical CT performed at [facility] yesterday.  It demonstrates degenerative changes, straightening of the cervical spine, spondylosis, etc.  There is no acute findings.  Assessment/Plan: Tiny traumatic subarachnoid hemorrhage: The patient is doing well clinically.  He was not previously anticoagulated.  I do not think further work-up or scans are needed.  I will sign off.  Please call if I can be of further assistance.  Jorge [name] 09/17/2018, 8:11 AM
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
## 2018-09-17 NOTE — Progress Notes (Signed)
Rehab Admissions Coordinator Note:  Patient was screened by Cleatrice Burke for appropriateness for an Inpatient Acute Rehab Consult per PT asdn OT recs.  At this time, we are recommending Inpatient Rehab consult. Please place order for consult.  Cleatrice Burke RN MSN 09/17/2018, 5:42 PM  I can be reached at [phone].
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
## 2018-09-17 NOTE — Progress Notes (Signed)
#  16 Coude catheter inserted with good urine output clear yellow. Medicated with lidocaine prior to insertion. Tolerated well. Foley secured with bard secure lock.

## 2018-09-18 ENCOUNTER — Inpatient Hospital Stay (HOSPITAL_COMMUNITY): Payer: Medicare HMO

## 2018-09-18 ENCOUNTER — Encounter (HOSPITAL_COMMUNITY): Payer: Self-pay | Admitting: Orthopedic Surgery

## 2018-09-18 LAB — CBC
HCT: 35.4 % — ABNORMAL LOW (ref 39.0–52.0)
Hemoglobin: 12.3 g/dL — ABNORMAL LOW (ref 13.0–17.0)
MCH: 30.1 pg (ref 26.0–34.0)
MCHC: 34.7 g/dL (ref 30.0–36.0)
MCV: 86.8 fL (ref 80.0–100.0)
Platelets: 172 10*3/uL (ref 150–400)
RBC: 4.08 MIL/uL — ABNORMAL LOW (ref 4.22–5.81)
RDW: 12.2 % (ref 11.5–15.5)
WBC: 14.6 10*3/uL — ABNORMAL HIGH (ref 4.0–10.5)
nRBC: 0 % (ref 0.0–0.2)

## 2018-09-18 LAB — BASIC METABOLIC PANEL
Anion gap: 9 (ref 5–15)
BUN: 20 mg/dL (ref 6–20)
CO2: 23 mmol/L (ref 22–32)
Calcium: 8.5 mg/dL — ABNORMAL LOW (ref 8.9–10.3)
Chloride: 106 mmol/L (ref 98–111)
Creatinine, Ser: 1.57 mg/dL — ABNORMAL HIGH (ref 0.61–1.24)
GFR calc Af Amer: 55 mL/min — ABNORMAL LOW (ref >60)
GFR calc non Af Amer: 48 mL/min — ABNORMAL LOW (ref >60)
Glucose, Bld: 181 mg/dL — ABNORMAL HIGH (ref 70–99)
Potassium: 4.2 mmol/L (ref 3.5–5.1)
Sodium: 138 mmol/L (ref 135–145)

## 2018-09-18 LAB — GLUCOSE, CAPILLARY
Glucose-Capillary: 163 mg/dL — ABNORMAL HIGH (ref 70–99)
Glucose-Capillary: 167 mg/dL — ABNORMAL HIGH (ref 70–99)
Glucose-Capillary: 170 mg/dL — ABNORMAL HIGH (ref 70–99)
Glucose-Capillary: 186 mg/dL — ABNORMAL HIGH (ref 70–99)
Glucose-Capillary: 194 mg/dL — ABNORMAL HIGH (ref 70–99)
Glucose-Capillary: 214 mg/dL — ABNORMAL HIGH (ref 70–99)

## 2018-09-18 MED ORDER — SODIUM CHLORIDE 0.9% FLUSH
10.0000 mL | INTRAVENOUS | Status: DC | PRN
  Administered 2018-09-26: 10 mL
  Filled 2018-09-18: qty 40

## 2018-09-18 MED ORDER — FENTANYL CITRATE (PF) 100 MCG/2ML IJ SOLN
100.0000 ug | INTRAMUSCULAR | Status: DC | PRN
  Administered 2018-09-18: 100 ug via INTRAVENOUS

## 2018-09-18 MED ORDER — FENTANYL CITRATE (PF) 100 MCG/2ML IJ SOLN
INTRAMUSCULAR | Status: AC
  Administered 2018-09-18: 100 ug via INTRAVENOUS
  Filled 2018-09-18: qty 2

## 2018-09-18 NOTE — Progress Notes (Signed)
One cell phone and charging cable were picked up from the front desk by this RN and given to patient.  Candy Sledge, RN
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
## 2018-09-18 NOTE — Progress Notes (Signed)
Patient's pain uncontrolled by current PO medications, current ordered IV medication has a hx of causing hallucinations. Trauma MD informed, new orders received.  Candy Sledge, RN
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
## 2018-09-19 LAB — GLUCOSE, CAPILLARY
Glucose-Capillary: 137 mg/dL — ABNORMAL HIGH (ref 70–99)
Glucose-Capillary: 142 mg/dL — ABNORMAL HIGH (ref 70–99)
Glucose-Capillary: 175 mg/dL — ABNORMAL HIGH (ref 70–99)
Glucose-Capillary: 175 mg/dL — ABNORMAL HIGH (ref 70–99)
Glucose-Capillary: 194 mg/dL — ABNORMAL HIGH (ref 70–99)
Glucose-Capillary: 204 mg/dL — ABNORMAL HIGH (ref 70–99)

## 2018-09-19 MED ORDER — MAGNESIUM CITRATE PO SOLN
0.5000 | Freq: Once | ORAL | Status: AC
  Administered 2018-09-19: 09:00:00 0.5 via ORAL
  Filled 2018-09-19: qty 296

## 2018-09-19 MED ORDER — GABAPENTIN 400 MG PO CAPS
400.0000 mg | ORAL_CAPSULE | Freq: Three times a day (TID) | ORAL | Status: DC
  Administered 2018-09-19 – 2018-09-26 (×22): 400 mg via ORAL
  Filled 2018-09-19 (×22): qty 1

## 2018-09-19 MED ORDER — FENTANYL CITRATE (PF) 100 MCG/2ML IJ SOLN
50.0000 ug | INTRAMUSCULAR | Status: DC | PRN
  Administered 2018-09-20 (×2): 50 ug via INTRAVENOUS
  Filled 2018-09-19 (×2): qty 2

## 2018-09-19 MED ORDER — ENOXAPARIN SODIUM 40 MG/0.4ML ~~LOC~~ SOLN
40.0000 mg | SUBCUTANEOUS | Status: DC
  Administered 2018-09-19 – 2018-09-26 (×8): 40 mg via SUBCUTANEOUS
  Filled 2018-09-19 (×8): qty 0.4
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
## 2018-09-19 NOTE — Progress Notes (Signed)
Inpatient Rehab Admissions:  Inpatient Rehab Consult received.  I met with patient at the bedside for rehabilitation assessment and to discuss goals and expectations of an inpatient rehab admission.  He is open to inpatient rehab admission.  States his wife is home during the day to care for their grand children.  I will attempt to speak with her to answers questions and confirm dispo from CIR.  I will also open up pt's insurance for prior authorization.  Will continue to follow for timing of admission pending insurance auth and bed availability.   Signed: Shann Medal, PT, DPT Admissions Coordinator [phone] 09/19/18  3:47 PM
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
## 2018-09-20 LAB — GLUCOSE, CAPILLARY
Glucose-Capillary: 136 mg/dL — ABNORMAL HIGH (ref 70–99)
Glucose-Capillary: 172 mg/dL — ABNORMAL HIGH (ref 70–99)
Glucose-Capillary: 174 mg/dL — ABNORMAL HIGH (ref 70–99)
Glucose-Capillary: 187 mg/dL — ABNORMAL HIGH (ref 70–99)
Glucose-Capillary: 220 mg/dL — ABNORMAL HIGH (ref 70–99)
Glucose-Capillary: 245 mg/dL — ABNORMAL HIGH (ref 70–99)
Glucose-Capillary: 279 mg/dL — ABNORMAL HIGH (ref 70–99)

## 2018-09-20 MED ORDER — LEVETIRACETAM 500 MG PO TABS
1000.0000 mg | ORAL_TABLET | Freq: Every day | ORAL | Status: DC
  Administered 2018-09-21 – 2018-09-26 (×6): 1000 mg via ORAL
  Filled 2018-09-20 (×6): qty 2

## 2018-09-20 MED ORDER — LEVETIRACETAM 500 MG PO TABS
2000.0000 mg | ORAL_TABLET | Freq: Every day | ORAL | Status: DC
  Administered 2018-09-20 – 2018-09-25 (×6): 2000 mg via ORAL
  Filled 2018-09-20 (×6): qty 4

## 2018-09-20 MED ORDER — ENSURE ENLIVE PO LIQD
237.0000 mL | Freq: Two times a day (BID) | ORAL | Status: DC
  Administered 2018-09-21 – 2018-09-26 (×10): 237 mL via ORAL

## 2018-09-20 NOTE — Progress Notes (Signed)
Patient ID: Jorge Cook, male   DOB: 22-Oct-1960, 58 y.o.   MRN: [id number] I called his wife and updated her as well as answered his quesitons.  Georganna Skeans, MD, MPH, FACS Trauma & General Surgery: [phone]

## 2018-09-20 NOTE — Progress Notes (Signed)
Pt arrived to room 5N10 from 4N. Received report from Melba, RN in ICU. See assessment. Will continue to monitor.

## 2018-09-20 NOTE — Progress Notes (Signed)
Dr. Grandville Silos was made aware that Pt's wife wants pt on scheduled fentanyl or oxycodone. Dr. Grandville Silos said no change in pt's orders.
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
## 2018-09-20 NOTE — Progress Notes (Signed)
Inpatient Rehab Admissions Coordinator:    I was able to speak to wife and she is able to provide pt with 24/7 supervision.  I await insurance authorization for possible admission next week pending bed availability and medical readiness.   Shann Medal, PT, DPT Admissions Coordinator [phone] 09/20/18  3:29 PM

## 2018-09-21 LAB — GLUCOSE, CAPILLARY
Glucose-Capillary: 129 mg/dL — ABNORMAL HIGH (ref 70–99)
Glucose-Capillary: 134 mg/dL — ABNORMAL HIGH (ref 70–99)
Glucose-Capillary: 147 mg/dL — ABNORMAL HIGH (ref 70–99)
Glucose-Capillary: 206 mg/dL — ABNORMAL HIGH (ref 70–99)
Glucose-Capillary: 255 mg/dL — ABNORMAL HIGH (ref 70–99)
Glucose-Capillary: 260 mg/dL — ABNORMAL HIGH (ref 70–99)

## 2018-09-21 LAB — CBC
HCT: 27.8 % — ABNORMAL LOW (ref 39.0–52.0)
Hemoglobin: 9.5 g/dL — ABNORMAL LOW (ref 13.0–17.0)
MCH: 30.2 pg (ref 26.0–34.0)
MCHC: 34.2 g/dL (ref 30.0–36.0)
MCV: 88.3 fL (ref 80.0–100.0)
Platelets: 168 10*3/uL (ref 150–400)
RBC: 3.15 MIL/uL — ABNORMAL LOW (ref 4.22–5.81)
RDW: 12.1 % (ref 11.5–15.5)
WBC: 6.8 10*3/uL (ref 4.0–10.5)
nRBC: 0 % (ref 0.0–0.2)

## 2018-09-21 LAB — BASIC METABOLIC PANEL
Anion gap: 9 (ref 5–15)
BUN: 18 mg/dL (ref 6–20)
CO2: 30 mmol/L (ref 22–32)
Calcium: 8.8 mg/dL — ABNORMAL LOW (ref 8.9–10.3)
Chloride: 104 mmol/L (ref 98–111)
Creatinine, Ser: 1.22 mg/dL (ref 0.61–1.24)
GFR calc Af Amer: >60 mL/min (ref >60)
GFR calc non Af Amer: >60 mL/min (ref >60)
Glucose, Bld: 129 mg/dL — ABNORMAL HIGH (ref 70–99)
Potassium: 3.5 mmol/L (ref 3.5–5.1)
Sodium: 143 mmol/L (ref 135–145)

## 2018-09-21 MED ORDER — ACETAMINOPHEN 500 MG PO TABS
1000.0000 mg | ORAL_TABLET | Freq: Four times a day (QID) | ORAL | Status: DC
  Administered 2018-09-21 – 2018-09-25 (×16): 1000 mg via ORAL
  Administered 2018-09-26: 500 mg via ORAL
  Administered 2018-09-26 (×2): 1000 mg via ORAL
  Filled 2018-09-21 (×20): qty 2

## 2018-09-21 MED ORDER — OXYCODONE HCL 5 MG PO TABS
5.0000 mg | ORAL_TABLET | ORAL | Status: DC | PRN
  Administered 2018-09-21 – 2018-09-26 (×9): 5 mg via ORAL
  Filled 2018-09-21 (×9): qty 1

## 2018-09-21 MED ORDER — FENTANYL CITRATE (PF) 100 MCG/2ML IJ SOLN: 25.0000 ug | INTRAMUSCULAR | Status: DC | PRN
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
## 2018-09-21 NOTE — Plan of Care (Signed)
  Problem: Coping: Goal: Level of anxiety will decrease Outcome: Progressing   Problem: Pain Managment: Goal: General experience of comfort will improve Outcome: Progressing   Problem: Safety: Goal: Ability to remain free from injury will improve Outcome: Progressing   Problem: Skin Integrity: Goal: Risk for impaired skin integrity will decrease Outcome: Progressing   

## 2018-09-21 NOTE — Plan of Care (Signed)
  Problem: Clinical Measurements: Goal: Ability to maintain clinical measurements within normal limits will improve Outcome: Progressing   Problem: Elimination: Goal: Will not experience complications related to bowel motility Outcome: Progressing   
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
## 2018-09-22 ENCOUNTER — Encounter (HOSPITAL_COMMUNITY): Payer: Self-pay

## 2018-09-22 LAB — GLUCOSE, CAPILLARY
Glucose-Capillary: 138 mg/dL — ABNORMAL HIGH (ref 70–99)
Glucose-Capillary: 134 mg/dL — ABNORMAL HIGH (ref 70–99)
Glucose-Capillary: 216 mg/dL — ABNORMAL HIGH (ref 70–99)
Glucose-Capillary: 181 mg/dL — ABNORMAL HIGH (ref 70–99)
Glucose-Capillary: 217 mg/dL — ABNORMAL HIGH (ref 70–99)

## 2018-09-22 LAB — BASIC METABOLIC PANEL
Anion gap: 10 (ref 5–15)
BUN: 21 mg/dL — ABNORMAL HIGH (ref 6–20)
CO2: 28 mmol/L (ref 22–32)
Calcium: 8.9 mg/dL (ref 8.9–10.3)
Chloride: 104 mmol/L (ref 98–111)
Creatinine, Ser: 1.25 mg/dL — ABNORMAL HIGH (ref 0.61–1.24)
GFR calc Af Amer: >60 mL/min (ref >60)
GFR calc non Af Amer: >60 mL/min (ref >60)
Glucose, Bld: 150 mg/dL — ABNORMAL HIGH (ref 70–99)
Potassium: 3.6 mmol/L (ref 3.5–5.1)
Sodium: 142 mmol/L (ref 135–145)

## 2018-09-22 LAB — CBC
HCT: 27.8 % — ABNORMAL LOW (ref 39.0–52.0)
Hemoglobin: 9.8 g/dL — ABNORMAL LOW (ref 13.0–17.0)
MCH: 30.1 pg (ref 26.0–34.0)
MCHC: 35.3 g/dL (ref 30.0–36.0)
MCV: 85.3 fL (ref 80.0–100.0)
Platelets: 180 10*3/uL (ref 150–400)
RBC: 3.26 MIL/uL — ABNORMAL LOW (ref 4.22–5.81)
RDW: 12.2 % (ref 11.5–15.5)
WBC: 6.7 10*3/uL (ref 4.0–10.5)
nRBC: 0 % (ref 0.0–0.2)

## 2018-09-22 LAB — MAGNESIUM: Magnesium: 1.9 mg/dL (ref 1.7–2.4)

## 2018-09-22 NOTE — Progress Notes (Signed)
CSW completed SBIRT assessment, risk level is low. See assessment for further information if needed.
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
## 2018-09-23 ENCOUNTER — Encounter (HOSPITAL_COMMUNITY): Payer: Self-pay | Admitting: General Practice

## 2018-09-23 LAB — GLUCOSE, CAPILLARY
Glucose-Capillary: 131 mg/dL — ABNORMAL HIGH (ref 70–99)
Glucose-Capillary: 136 mg/dL — ABNORMAL HIGH (ref 70–99)
Glucose-Capillary: 158 mg/dL — ABNORMAL HIGH (ref 70–99)
Glucose-Capillary: 200 mg/dL — ABNORMAL HIGH (ref 70–99)
Glucose-Capillary: 259 mg/dL — ABNORMAL HIGH (ref 70–99)
Glucose-Capillary: 274 mg/dL — ABNORMAL HIGH (ref 70–99)

## 2018-09-23 MED ORDER — FUROSEMIDE 20 MG PO TABS: 20.0000 mg | ORAL_TABLET | Freq: Every day | ORAL | Status: DC | PRN

## 2018-09-23 MED ORDER — HYDRALAZINE HCL 20 MG/ML IJ SOLN
10.0000 mg | Freq: Four times a day (QID) | INTRAMUSCULAR | Status: DC | PRN
  Administered 2018-09-23: 10 mg via INTRAVENOUS
  Filled 2018-09-23: qty 1

## 2018-09-23 MED ORDER — DILTIAZEM HCL ER COATED BEADS 120 MG PO CP24
120.0000 mg | ORAL_CAPSULE | Freq: Every day | ORAL | Status: DC
  Administered 2018-09-23 – 2018-09-26 (×4): 120 mg via ORAL
  Filled 2018-09-23 (×4): qty 1

## 2018-09-23 MED ORDER — CARVEDILOL 25 MG PO TABS
25.0000 mg | ORAL_TABLET | Freq: Two times a day (BID) | ORAL | Status: DC
  Administered 2018-09-23 – 2018-09-26 (×7): 25 mg via ORAL
  Filled 2018-09-23 (×7): qty 1

## 2018-09-23 MED ORDER — ATORVASTATIN CALCIUM 40 MG PO TABS
40.0000 mg | ORAL_TABLET | Freq: Every day | ORAL | Status: DC
  Administered 2018-09-23 – 2018-09-26 (×4): 40 mg via ORAL
  Filled 2018-09-23 (×4): qty 1
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
## 2018-09-23 NOTE — Progress Notes (Signed)
Inpatient Rehab Admissions Coordinator:   Sent updated clinicals to [facility] for prior authorization.  Continue to await decision.   Shann Medal, PT, DPT Admissions Coordinator [phone] 09/23/18  2:37 PM

## 2018-09-23 NOTE — Progress Notes (Signed)
Notified Dr Grandville Silos, on call for trauma service that pt's BP is 161/104, P108, rechecked BP was 155/100. Will continue to monitor.

## 2018-09-23 NOTE — Plan of Care (Signed)
  Problem: Activity: Goal: Risk for activity intolerance will decrease Outcome: Progressing   Problem: Coping: Goal: Level of anxiety will decrease Outcome: Progressing   Problem: Safety: Goal: Ability to remain free from injury will improve Outcome: Progressing   Problem: Skin Integrity: Goal: Risk for impaired skin integrity will decrease Outcome: Progressing   Problem: Pain Management: Goal: Pain level will decrease with appropriate interventions Outcome: Progressing

## 2018-09-24 LAB — GLUCOSE, CAPILLARY
Glucose-Capillary: 140 mg/dL — ABNORMAL HIGH (ref 70–99)
Glucose-Capillary: 148 mg/dL — ABNORMAL HIGH (ref 70–99)
Glucose-Capillary: 155 mg/dL — ABNORMAL HIGH (ref 70–99)
Glucose-Capillary: 155 mg/dL — ABNORMAL HIGH (ref 70–99)
Glucose-Capillary: 166 mg/dL — ABNORMAL HIGH (ref 70–99)
Glucose-Capillary: 233 mg/dL — ABNORMAL HIGH (ref 70–99)

## 2018-09-24 MED ORDER — INSULIN ASPART 100 UNIT/ML ~~LOC~~ SOLN: 0.0000 [IU] | Freq: Every day | SUBCUTANEOUS | Status: DC

## 2018-09-24 MED ORDER — INSULIN GLARGINE 100 UNIT/ML ~~LOC~~ SOLN
14.0000 [IU] | Freq: Every day | SUBCUTANEOUS | Status: DC
  Administered 2018-09-24 – 2018-09-25 (×2): 14 [IU] via SUBCUTANEOUS
  Filled 2018-09-24 (×3): qty 0.14

## 2018-09-24 MED ORDER — INSULIN ASPART 100 UNIT/ML ~~LOC~~ SOLN
0.0000 [IU] | Freq: Three times a day (TID) | SUBCUTANEOUS | Status: DC
  Administered 2018-09-24: 5 [IU] via SUBCUTANEOUS
  Administered 2018-09-24: 3 [IU] via SUBCUTANEOUS
  Administered 2018-09-25: 5 [IU] via SUBCUTANEOUS
  Administered 2018-09-25: 8 [IU] via SUBCUTANEOUS
  Administered 2018-09-25: 3 [IU] via SUBCUTANEOUS
  Administered 2018-09-26: 5 [IU] via SUBCUTANEOUS

## 2018-09-24 NOTE — Care Management Important Message (Signed)
Important Message  Patient Details  Name: Jorge Cook MRN: [id number] Date of Birth: [birth year]   Medicare Important Message Given:  Yes     Memory Argue 09/24/2018, 1:36 PM    IM GIVEN TO PATIENT
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
## 2018-09-24 NOTE — Plan of Care (Signed)
  Problem: Activity: Goal: Risk for activity intolerance will decrease Outcome: Progressing   Problem: Coping: Goal: Level of anxiety will decrease Outcome: Progressing   Problem: Safety: Goal: Ability to remain free from injury will improve Outcome: Progressing   Problem: Skin Integrity: Goal: Risk for impaired skin integrity will decrease Outcome: Progressing   

## 2018-09-24 NOTE — Progress Notes (Signed)
Inpatient Diabetes Program Recommendations  AACE/ADA: New Consensus Statement on Inpatient Glycemic Control (2015)  Target Ranges:  Prepandial:   less than 140 mg/dL      Peak postprandial:   less than 180 mg/dL (1-2 hours)      Critically ill patients:  140 - 180 mg/dL   Lab Results  Component Value Date   GLUCAP 140 (H) 09/24/2018   HGBA1C 7.4 (H) 09/17/2018    Review of Glycemic Control Results for ARUL, FARABEE (MRN [id number]) as of 09/24/2018 11:05  Ref. Range 09/23/2018 15:40 09/23/2018 19:53 09/24/2018 00:02 09/24/2018 04:01 09/24/2018 07:33  Glucose-Capillary Latest Ref Range: 70 - 99 mg/dL 259 (H) 274 (H) 155 (H) 148 (H) 140 (H)   Diabetes history: DM 2 Outpatient Diabetes medications:  Xultophy 100-3.6 units- 27 units daily Current orders for Inpatient glycemic control:  Novolog moderate q 4 hours Inpatient Diabetes Program Recommendations:   Please consider changing Novolog correction to tid with meals and HS. Also please add Lantus 14 units daily (this is 1/2 of home dose).   Thanks,  Adah Perl, RN, BC-ADM Inpatient Diabetes Coordinator Pager [phone] (8a-5p)
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
## 2018-09-24 NOTE — Progress Notes (Signed)
Inpatient Rehab Admissions Coordinator:   Received notification that pt's insurance company has denied authorization for inpatient rehab.  Rehab MD to do peer to peer to attempt to overturn denial.  Pt/family aware.  Will continue to follow.   Shann Medal, PT, DPT Admissions Coordinator [phone] 09/24/18  5:47 PM

## 2018-09-25 LAB — GLUCOSE, CAPILLARY
Glucose-Capillary: 151 mg/dL — ABNORMAL HIGH (ref 70–99)
Glucose-Capillary: 186 mg/dL — ABNORMAL HIGH (ref 70–99)
Glucose-Capillary: 196 mg/dL — ABNORMAL HIGH (ref 70–99)
Glucose-Capillary: 207 mg/dL — ABNORMAL HIGH (ref 70–99)
Glucose-Capillary: 252 mg/dL — ABNORMAL HIGH (ref 70–99)
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
## 2018-09-25 NOTE — Progress Notes (Signed)
Inpatient Rehab Admissions Coordinator:   Received notification from patient's insurance company that denial was upheld following peer to peer review.  I let pt/family know.  They would like to d/c home and pursue either [facility] or outpatient pending recommendations from acute therapy team.  I will let attending and therapy team know.   Shann Medal, PT, DPT Admissions Coordinator [phone] 09/25/18  12:30 PM

## 2018-09-25 NOTE — Progress Notes (Signed)
Patient ID: Jorge Cook, male   DOB: [birth year], 58 y.o.   MRN: [id number]    9 Days Post-Op  Subjective: No new complaints.  Objective: Vital signs in last 24 hours: Temp:  [98 F (36.7 C)-98.6 F (37 C)] 98 F (36.7 C) (08/05 0914) Pulse Rate:  [93-100] 100 (08/05 0914) Resp:  [14-18] 14 (08/05 0914) BP: (124-136)/(83-89) 134/84 (08/05 0914) SpO2:  [94 %-95 %] 95 % (08/05 0914) Last BM Date: 09/22/18  Intake/Output from previous day: 08/04 0701 - 08/05 0700 In: 1080 [P.O.:1080] Out: 720 [Urine:720] Intake/Output this shift: Total I/O In: -  Out: 300 [Urine:300]  PE: Gen: NAD Heart: regular, mildly tachy Lungs: CTAB, some left sided chest wall tenderness, pulls over 1750 on IS Abd: soft, NT, ND Ext: Moves BLE.  LLE in ace wrap.  Normal sensation of both feet.  Lab Results:  No results for input(s): WBC, HGB, HCT, PLT in the last 72 hours. BMET No results for input(s): NA, K, CL, CO2, GLUCOSE, BUN, CREATININE, CALCIUM in the last 72 hours. PT/INR No results for input(s): LABPROT, INR in the last 72 hours. CMP     Component Value Date/Time   NA 142 09/22/2018 0427   K 3.6 09/22/2018 0427   CL 104 09/22/2018 0427   CO2 28 09/22/2018 0427   GLUCOSE 150 (H) 09/22/2018 0427   BUN 21 (H) 09/22/2018 0427   CREATININE 1.25 (H) 09/22/2018 0427   CALCIUM 8.9 09/22/2018 0427   PROT 7.6 09/16/2018 1542   ALBUMIN 4.5 09/16/2018 1542   AST 33 09/16/2018 1542   ALT 31 09/16/2018 1542   ALKPHOS 80 09/16/2018 1542   BILITOT 1.1 09/16/2018 1542   GFRNONAA >60 09/22/2018 0427   GFRAA >60 09/22/2018 0427   Lipase  No results found for: LIPASE     Studies/Results: No results found.  Anti-infectives: Anti-infectives (From admission, onward)   Start     Dose/Rate Route Frequency Ordered Stop   09/17/18 0000  ceFAZolin (ANCEF) IVPB 2g/100 mL premix     2 g 200 mL/hr over 30 Minutes Intravenous Every 6 hours 09/16/18 2259 09/17/18 1306   09/16/18 1845   ceFAZolin (ANCEF) IVPB 2g/100 mL premix     2 g 200 mL/hr over 30 Minutes Intravenous  Once 09/16/18 1833 09/16/18 1847   09/16/18 1835  ceFAZolin (ANCEF) 2-4 GM/100ML-% IVPB    Note to Pharmacy: [name]   : cabinet override      09/16/18 1835 09/16/18 1847       Assessment/Plan [facility] TBI/L SAH- per Dr. Arnoldo Morale no repeat CT nor F/U needed L rib FX 5, 7-10, pulm contusion- pulmonary toilet, multimodal pain control, doing1000 on IS HX metastatic renal CA includingRUL hilar nodule, brain met Hx SZ- homeVimpat, Keppra L tib fib FX- S/P IM nail by Dr. [name], [name] DM - SSI CKD- improving, stage 2, labs in AM HTN- resume all home antihypertensive meds, BP much better today FEN- diet carb mod, pain control is good Anemia- hbg stable Acute urinary retention- resolved on urecholine and Flomax, has a condom cath VTE- PAS, Lovneox Dispo- insurance denied by CIR, PT to eval for recommendations for possible Home   LOS: 9 days    [name] , [facility] Surgery 09/25/2018, 12:49 PM Pager: [phone]
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
## 2018-09-25 NOTE — Plan of Care (Signed)

## 2018-09-26 LAB — GLUCOSE, CAPILLARY
Glucose-Capillary: 129 mg/dL — ABNORMAL HIGH (ref 70–99)
Glucose-Capillary: 130 mg/dL — ABNORMAL HIGH (ref 70–99)
Glucose-Capillary: 153 mg/dL — ABNORMAL HIGH (ref 70–99)
Glucose-Capillary: 226 mg/dL — ABNORMAL HIGH (ref 70–99)

## 2018-09-26 MED ORDER — GABAPENTIN 400 MG PO CAPS: 400.0000 mg | ORAL_CAPSULE | Freq: Three times a day (TID) | ORAL | 0 refills | Status: AC

## 2018-09-26 MED ORDER — OXYCODONE HCL 5 MG PO TABS: 5.0000 mg | ORAL_TABLET | ORAL | 0 refills | Status: AC | PRN

## 2018-09-26 MED ORDER — ACETAMINOPHEN 500 MG PO TABS: 1000.0000 mg | ORAL_TABLET | Freq: Four times a day (QID) | ORAL | 0 refills | Status: AC | PRN

## 2018-09-26 MED ORDER — METHOCARBAMOL 500 MG PO TABS: 500.0000 mg | ORAL_TABLET | Freq: Four times a day (QID) | ORAL | 0 refills | Status: AC | PRN

## 2018-09-26 NOTE — Progress Notes (Signed)
Inpatient Diabetes Program Recommendations  AACE/ADA: New Consensus Statement on Inpatient Glycemic Control (2015)  Target Ranges:  Prepandial:   less than 140 mg/dL      [place] postprandial:   less than 180 mg/dL (1-2 hours)      Critically ill patients:  140 - 180 mg/dL   Lab Results  Component Value Date   GLUCAP 153 (H) 09/26/2018   HGBA1C 7.4 (H) 09/17/2018    Review of Glycemic Control Results for ROMEO, ZIELINSKI (MRN [id number]) as of 09/26/2018 11:18  Ref. Range 09/25/2018 16:21 09/25/2018 20:23 09/26/2018 00:13 09/26/2018 04:15  Glucose-Capillary Latest Ref Range: 70 - 99 mg/dL 252 (H) 186 (H) 130 (H) 129 (H)   Diabetes history: Type 2 DM Outpatient Diabetes medications: Xultophy 100-3.6 27 units QD Current orders for Inpatient glycemic control: Novolog 0-15 units TID, Novolog 0-5 units QHS, Lantus 14 units QHS  Inpatient Diabetes Program Recommendations:    If patient's postprandials continue to exceed 180 mg/dL, consider adding Novolog 4 units TID (Assuming patient is consuming >50% of meals).  Thanks, Bronson Curb, MSN, RNC-OB Diabetes Coordinator [phone] (8a-5p)

## 2018-09-26 NOTE — Progress Notes (Signed)
DISCHARGE NOTE HOME Yeshaya Vath to be discharged Home per MD order. Discussed prescriptions and follow up appointments with the patient. Prescriptions given to patient; medication list explained in detail. Patient verbalized understanding.  Skin clean, dry and intact without evidence of skin break down, no evidence of skin tears noted. IV catheter discontinued intact. Site without signs and symptoms of complications. Dressing and pressure applied. Pt denies pain at the site currently. No complaints noted.  Patient free of lines and drains.  An After Visit Summary (AVS) was printed and given to the patient. Patient escorted via wheelchair, and discharged home via private auto.  Paulla Fore, RN
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
## 2018-09-26 NOTE — Progress Notes (Signed)
Provided discharge education/instructions, all questions and concerns addressed, Pt not in distress, to discharge home with belongings accompanied by wife. BSC and RW at bedside.
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
## 2018-09-26 NOTE — Discharge Instructions (Signed)
Weightbearing: WBAT LLE  Insicional and dressing care: Dressings left intact until follow-up.  Reinforce PRN. Orthopedic device(s): CAM boot for comfort and when walking Showering: Keep dressing dry   Rib Fracture  A rib fracture is a break or crack in one of the bones of the ribs. The ribs are like a cage that goes around your upper chest. A broken or cracked rib is often painful, but most do not cause other problems. Most rib fractures usually heal on their own in 1-3 months. Follow these instructions at home: Managing pain, stiffness, and swelling  If directed, apply ice to the injured area. ? Put ice in a plastic bag. ? Place a towel between your skin and the bag. ? Leave the ice on for 20 minutes, 2-3 times a day.  Take over-the-counter and prescription medicines only as told by your doctor. Activity  Avoid activities that cause pain to the injured area. Protect your injured area.  Slowly increase activity as told by your doctor. General instructions  Do deep breathing as told by your doctor. You may be told to: ? Take deep breaths many times a day. ? Cough many times a day while hugging a pillow. ? Use a device (incentive spirometer) to do deep breathing many times a day.  Drink enough fluid to keep your pee (urine) clear or pale yellow.  Do not wear a rib belt or binder. These do not allow you to breathe deeply.  Keep all follow-up visits as told by your doctor. This is important. Contact a doctor if:  You have a fever. Get help right away if:  You have trouble breathing.  You are short of breath.  You cannot stop coughing.  You cough up thick or bloody spit (sputum).  You feel sick to your stomach (nauseous), throw up (vomit), or have belly (abdominal) pain.  Your pain gets worse and medicine does not help. Summary  A rib fracture is a break or crack in one of the bones of the ribs.  Apply ice to the injured area and take medicines for pain as told by  your doctor.  Take deep breaths and cough many times a day. Hug a pillow every time you cough. This information is not intended to replace advice given to you by your health care provider. Make sure you discuss any questions you have with your health care provider. Document Released: 11/16/2007 Document Revised: 01/19/2017 Document Reviewed: 05/09/2016 Elsevier Patient Education  2020 [employer].
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
